# Patient Record
Sex: Female | Born: 1960 | Race: White | Hispanic: No | Smoking: Former smoker
Health system: Southern US, Community
[De-identification: ages and names within clinical notes are randomized; demographics above are authoritative.]

## PROBLEM LIST (undated history)

## (undated) DIAGNOSIS — I10 Essential (primary) hypertension: Secondary | ICD-10-CM

## (undated) DIAGNOSIS — E119 Type 2 diabetes mellitus without complications: Secondary | ICD-10-CM

## (undated) HISTORY — PX: HERNIA REPAIR: SHX51

---

## 2018-12-14 ENCOUNTER — Other Ambulatory Visit (HOSPITAL_BASED_OUTPATIENT_CLINIC_OR_DEPARTMENT_OTHER): Payer: Self-pay | Admitting: Physician Assistant

## 2018-12-14 ENCOUNTER — Other Ambulatory Visit (HOSPITAL_BASED_OUTPATIENT_CLINIC_OR_DEPARTMENT_OTHER): Payer: Self-pay

## 2018-12-14 DIAGNOSIS — R1900 Intra-abdominal and pelvic swelling, mass and lump, unspecified site: Secondary | ICD-10-CM

## 2018-12-18 ENCOUNTER — Other Ambulatory Visit: Payer: Self-pay

## 2018-12-18 ENCOUNTER — Ambulatory Visit (HOSPITAL_BASED_OUTPATIENT_CLINIC_OR_DEPARTMENT_OTHER)
Admission: RE | Admit: 2018-12-18 | Discharge: 2018-12-18 | Disposition: A | Discharge status: Home / Self Care | Payer: BC Managed Care – PPO | Source: Ambulatory Visit | Attending: Physician Assistant | Admitting: Physician Assistant

## 2018-12-18 DIAGNOSIS — R1900 Intra-abdominal and pelvic swelling, mass and lump, unspecified site: Secondary | ICD-10-CM | POA: Insufficient documentation

## 2019-01-05 ENCOUNTER — Other Ambulatory Visit (HOSPITAL_BASED_OUTPATIENT_CLINIC_OR_DEPARTMENT_OTHER): Payer: Self-pay | Admitting: Emergency Medicine

## 2019-01-05 DIAGNOSIS — R1032 Left lower quadrant pain: Secondary | ICD-10-CM

## 2019-01-12 ENCOUNTER — Ambulatory Visit (HOSPITAL_BASED_OUTPATIENT_CLINIC_OR_DEPARTMENT_OTHER)
Admission: RE | Admit: 2019-01-12 | Discharge: 2019-01-12 | Disposition: A | Discharge status: Home / Self Care | Payer: BC Managed Care – PPO | Source: Ambulatory Visit | Attending: Emergency Medicine | Admitting: Emergency Medicine

## 2019-01-12 ENCOUNTER — Encounter (HOSPITAL_BASED_OUTPATIENT_CLINIC_OR_DEPARTMENT_OTHER): Payer: Self-pay

## 2019-01-12 ENCOUNTER — Other Ambulatory Visit: Payer: Self-pay

## 2019-01-12 DIAGNOSIS — R1032 Left lower quadrant pain: Secondary | ICD-10-CM | POA: Insufficient documentation

## 2019-01-12 MED ORDER — IOHEXOL 300 MG/ML  SOLN
100.0000 mL | Freq: Once | INTRAMUSCULAR | Status: AC | PRN
  Administered 2019-01-12: 100 mL via INTRAVENOUS

## 2020-03-03 ENCOUNTER — Emergency Department (INDEPENDENT_AMBULATORY_CARE_PROVIDER_SITE_OTHER)
Admission: EM | Admit: 2020-03-03 | Discharge: 2020-03-03 | Disposition: A | Discharge status: Home / Self Care | Payer: BC Managed Care – PPO | Source: Home / Self Care

## 2020-03-03 ENCOUNTER — Encounter: Payer: Self-pay | Admitting: Emergency Medicine

## 2020-03-03 ENCOUNTER — Other Ambulatory Visit: Payer: Self-pay

## 2020-03-03 DIAGNOSIS — J22 Unspecified acute lower respiratory infection: Secondary | ICD-10-CM

## 2020-03-03 DIAGNOSIS — R059 Cough, unspecified: Secondary | ICD-10-CM

## 2020-03-03 HISTORY — DX: Essential (primary) hypertension: I10

## 2020-03-03 HISTORY — DX: Type 2 diabetes mellitus without complications: E11.9

## 2020-03-03 MED ORDER — METHYLPREDNISOLONE SODIUM SUCC 125 MG IJ SOLR
80.0000 mg | Freq: Once | INTRAMUSCULAR | Status: AC
  Administered 2020-03-03: 80 mg via INTRAMUSCULAR

## 2020-03-03 MED ORDER — PREDNISONE 20 MG PO TABS: 20.0000 mg | ORAL_TABLET | Freq: Two times a day (BID) | ORAL | 0 refills | Status: AC

## 2020-03-03 MED ORDER — AZITHROMYCIN 250 MG PO TABS: ORAL_TABLET | ORAL | 0 refills | Status: AC

## 2020-03-03 NOTE — ED Triage Notes (Signed)
Sinus Congestion, Rt ear Pain x 3 days Unvaccinated

## 2020-03-03 NOTE — Discharge Instructions (Addendum)
Go home to rest Drink plenty of fluids Take Tylenol for pain or fever You may take over-the-counter cough and cold medicines as needed I have prescribed azithromycin and prednisone for the bronchitis You must quarantine at home until your test result is available You can check for your test result in MyChart

## 2020-03-05 LAB — COVID-19, FLU A+B NAA
Influenza A, NAA: NOT DETECTED
Influenza B, NAA: NOT DETECTED
SARS-CoV-2, NAA: NOT DETECTED

## 2021-01-20 IMAGING — US US ABDOMEN LIMITED
1 series · 13 of 25 positions shown · non-contrast
Comparison: None.

CLINICAL DATA: Soft tissue prominence along the superficial left
lower abdominal wall

EXAM:
ULTRASOUND ABDOMEN LIMITED/LEFT LOWER QUADRANT ABDOMINAL WALL

[Series 1: us abdomen limited · 36 acquisitions, 13 frames shown]
[im 1/36]
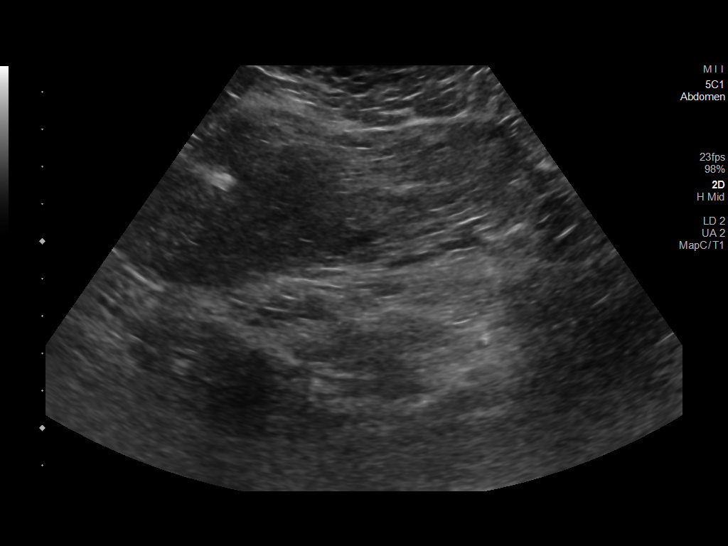
[im 3/36]
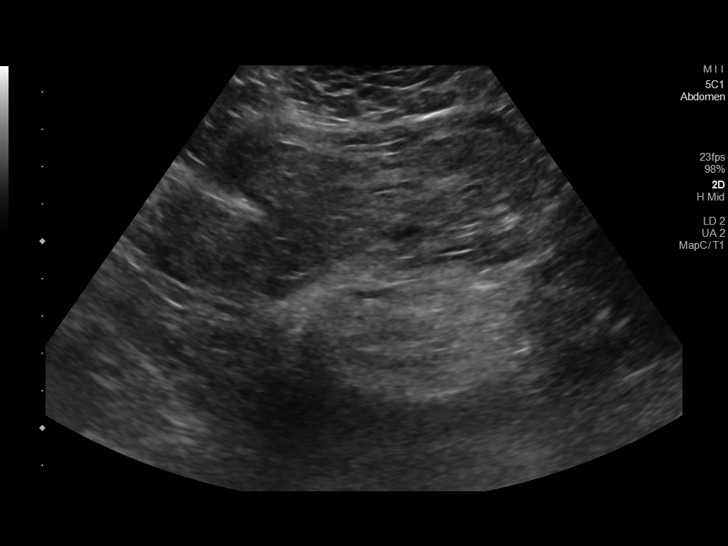
[im 6/36]
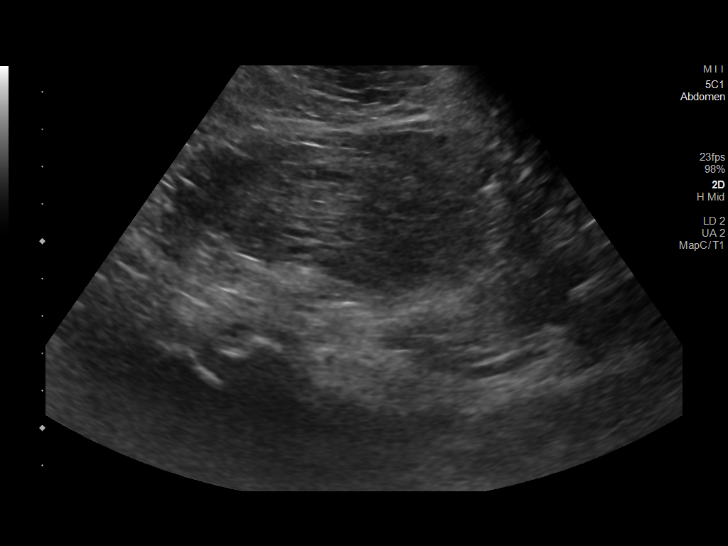
[im 9/36]
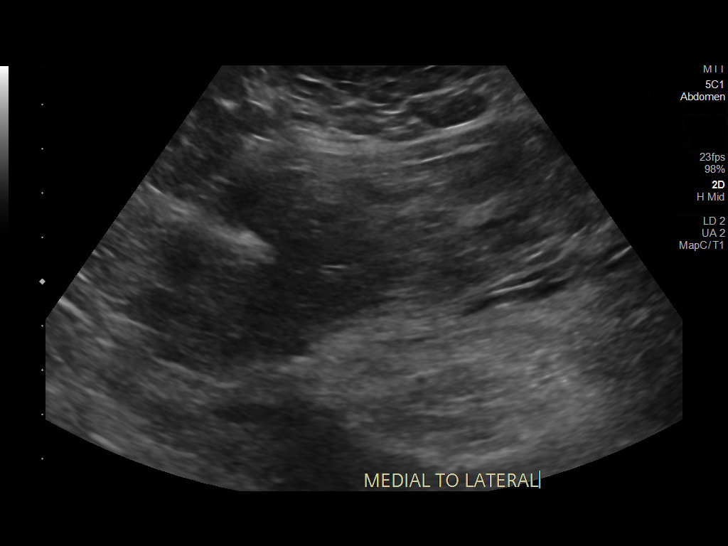
[im 12/36]
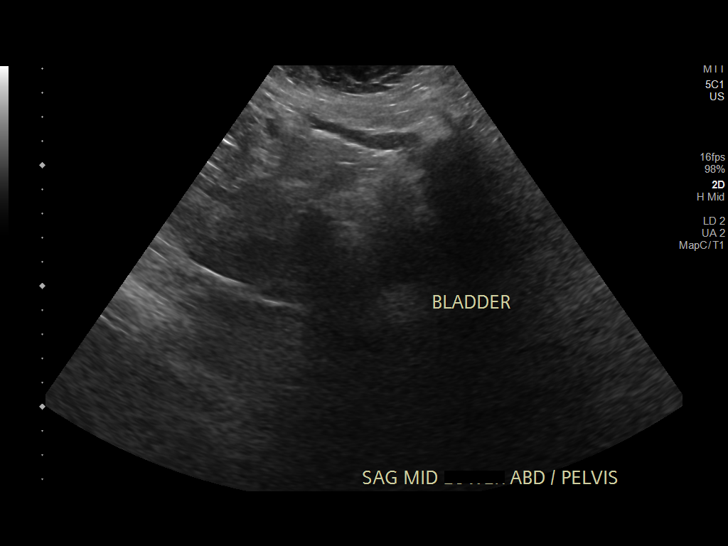
[im 15/36]
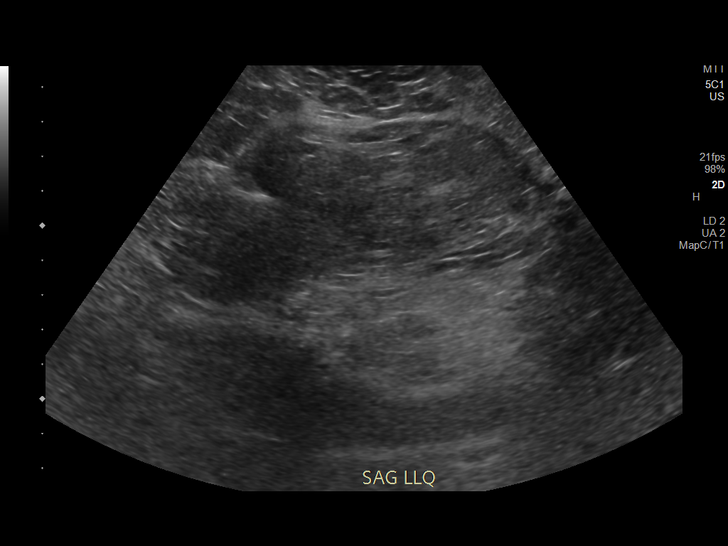
[im 18/36]
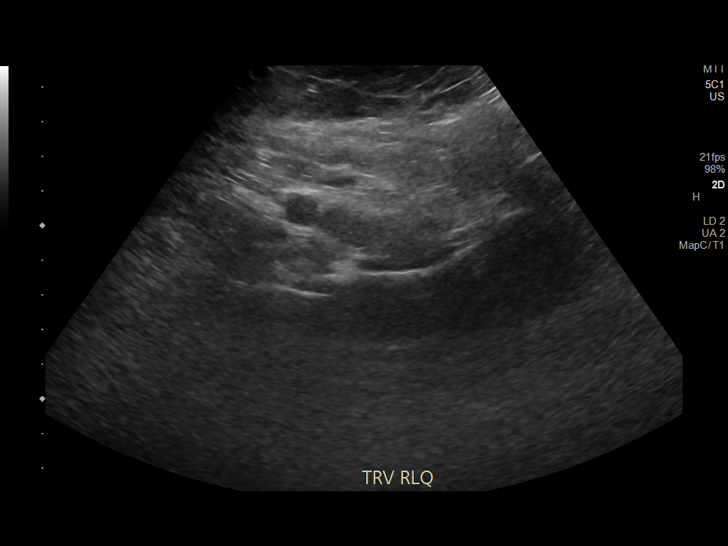
[im 21/36]
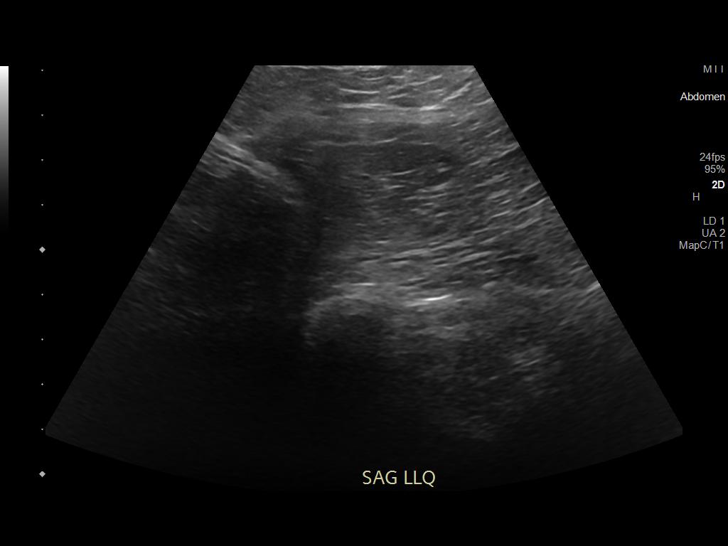
[im 24/36]
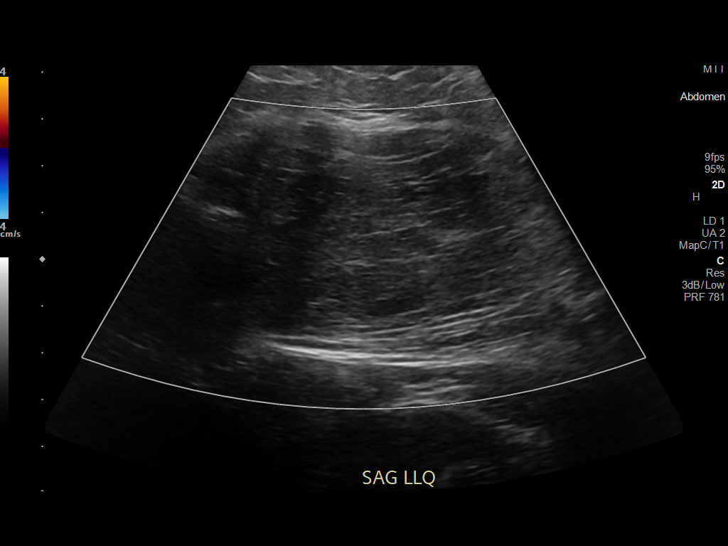
[im 27/36]
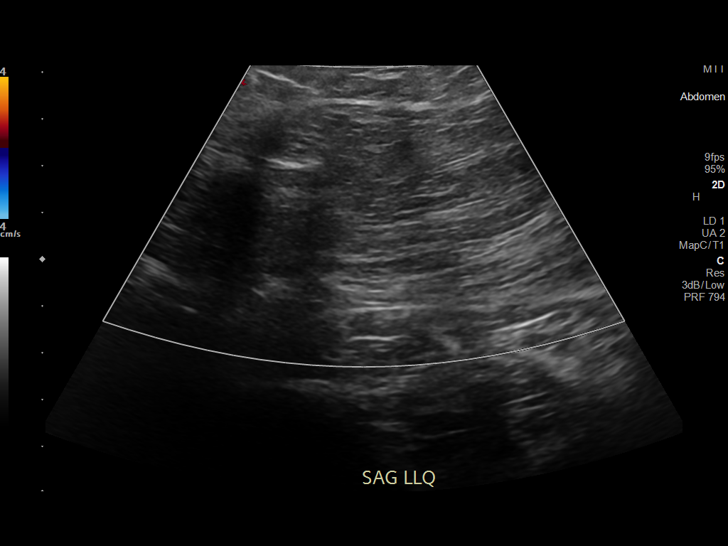
[im 30/36]
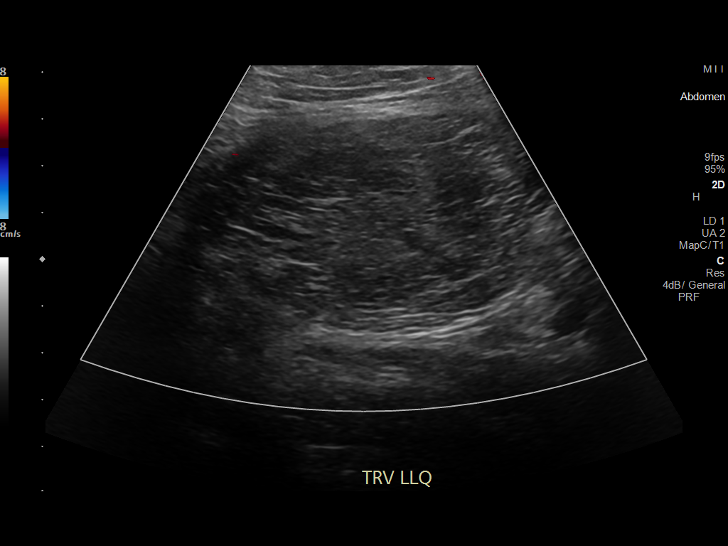
[im 33/36]
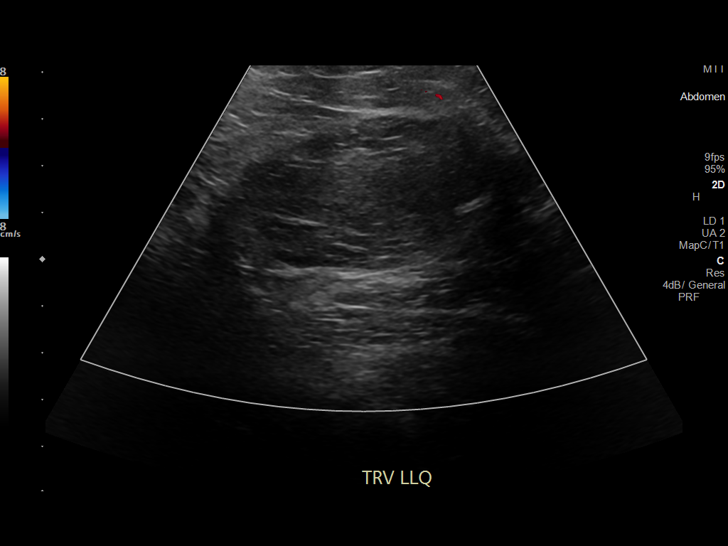
[im 36/36]
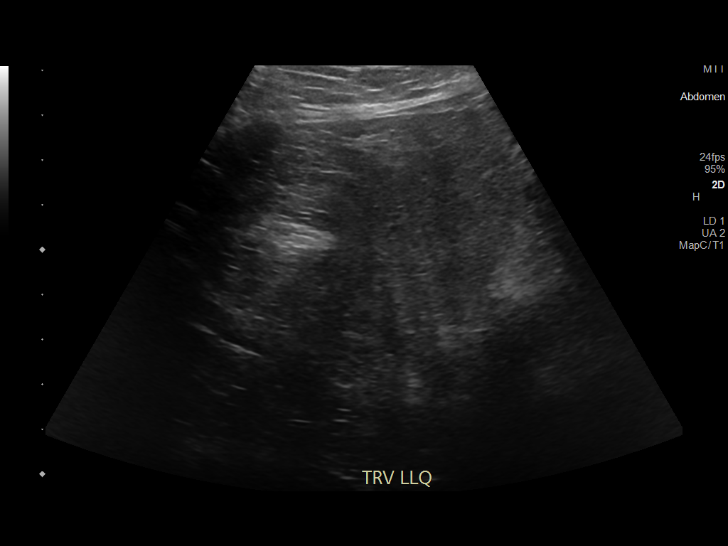

[13 of 25 positions shown; findings below may reference images not displayed]

FINDINGS: Longitudinal and transverse images were obtained along the left
lower quadrant abdominal wall. There is a fairly well-circumscribed
slightly inhomogeneous area of predominantly increased echogenicity
measuring 10.4 x 5.0 x 8.9 cm in the subcutaneous left lower
quadrant abdominal wall region. There is no surrounding fluid. No
nearby adenopathy. No hernia seen in this area.
IMPRESSION: 1. 10.4 x 5.0 x 8.9 cm in the subcutaneous left lower quadrant
abdominal wall at the site of palpable fullness. No associated
hernia. This lesion appears to contain fat but cannot be delineated
as a benign lipoma on this study given some inhomogeneity in this
lesion. Possibility of fat containing neoplasm within this area
cannot be excluded by ultrasound. Given this circumstance, would
advise CT or MR of this area, ideally with intravenous contrast, to
further assess.

2.  No other lesion evident in this area.

These results will be called to the ordering clinician or
representative by the Radiologist Assistant, and communication
documented in the PACS or [employer] Dashboard.

## 2021-02-14 IMAGING — CT CT ABD-PELV W/ CM
2 of 5 series · 16 of 46 positions shown, 18 images · IV contrast (Omnipaque)
Comparison: None.

CLINICAL DATA: Left lower quadrant mass

EXAM:
CT ABDOMEN AND PELVIS WITH CONTRAST
TECHNIQUE: Multidetector CT imaging of the abdomen and pelvis was performed
using the standard protocol following bolus administration of
intravenous contrast.
CONTRAST:  100mL OMNIPAQUE IOHEXOL 300 MG/ML SOLN, additional oral
enteric contrast

[Series 2: axial st · axial · 0.98mm/px · z∈[-579,-109]mm · 13 of 106 slices shown, 15 images]
[im 6/106  soft-tissue]
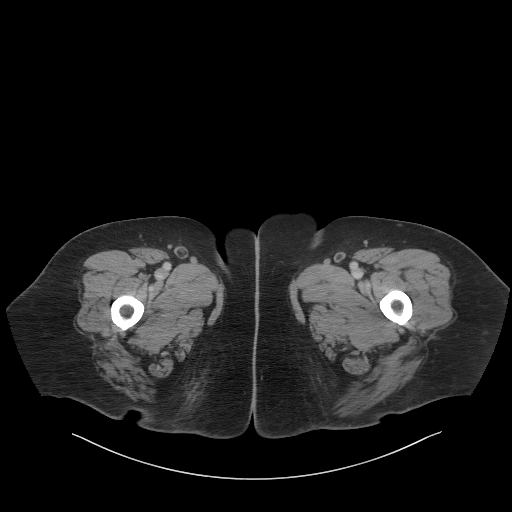
[im 6/106  bone]
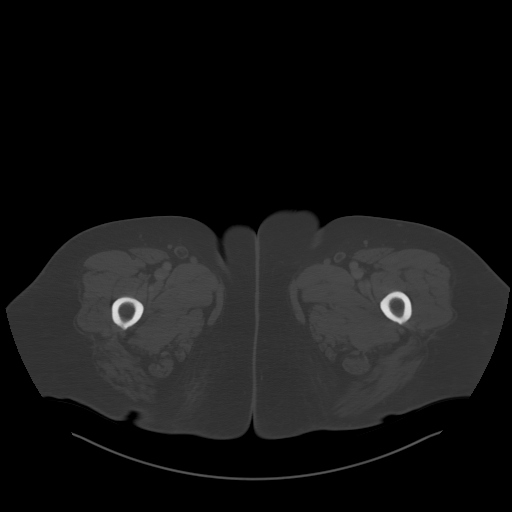
[im 17/106  soft-tissue]
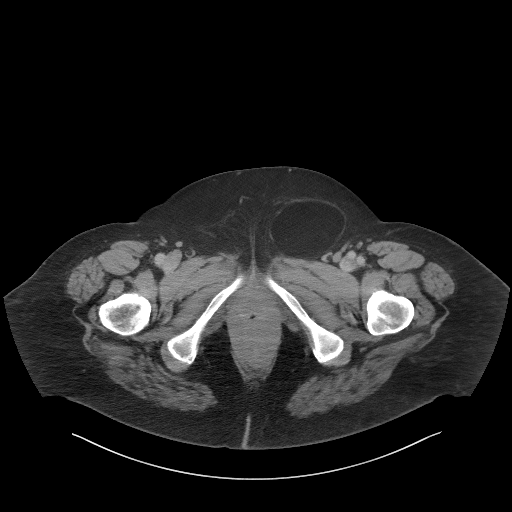
[im 23/106  soft-tissue]
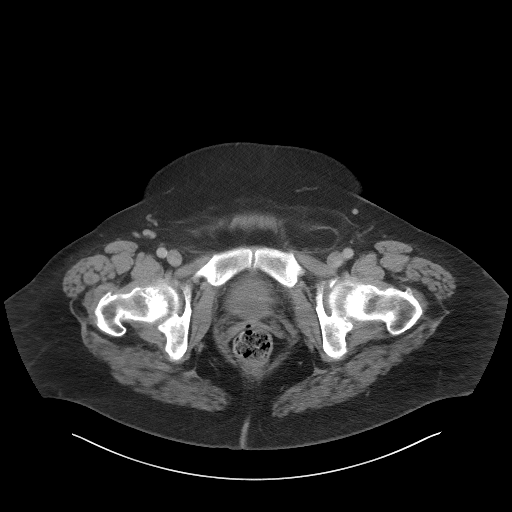
[im 28/106  soft-tissue]
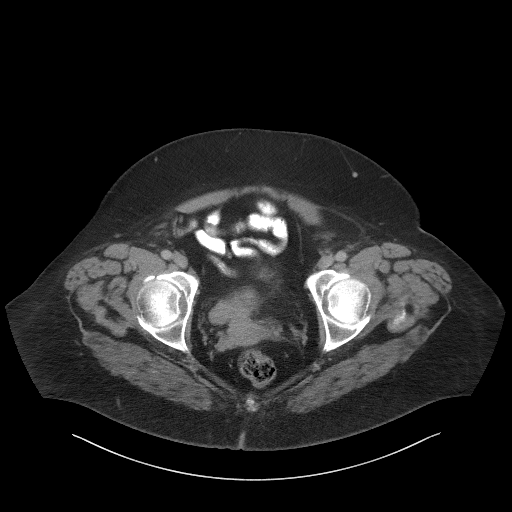
[im 39/106  soft-tissue]
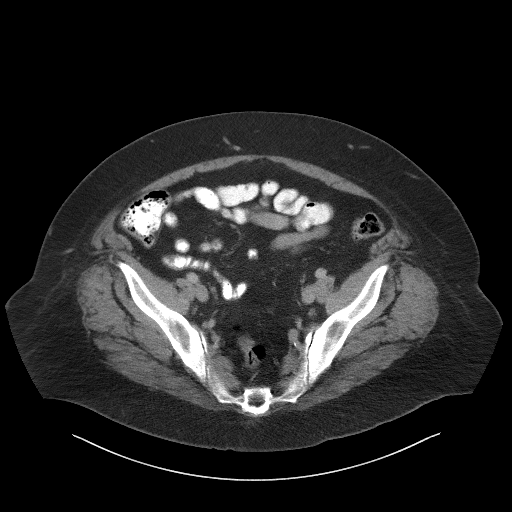
[im 45/106  soft-tissue]
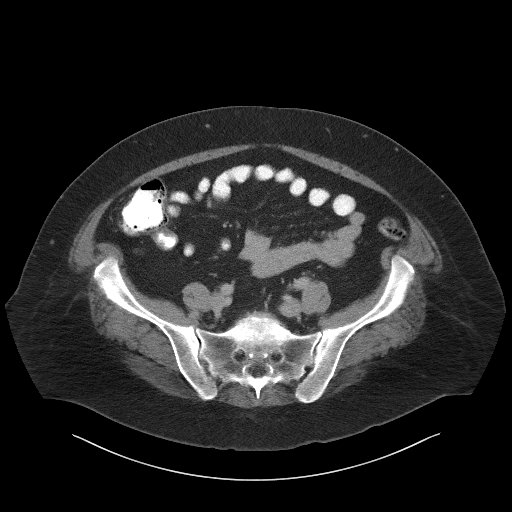
[im 56/106  soft-tissue]
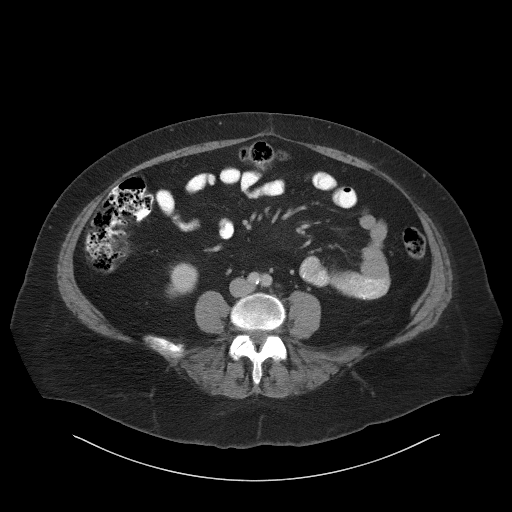
[im 61/106  soft-tissue]
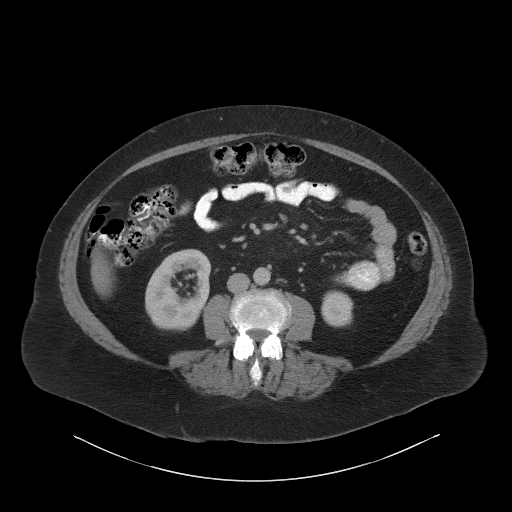
[im 67/106  soft-tissue]
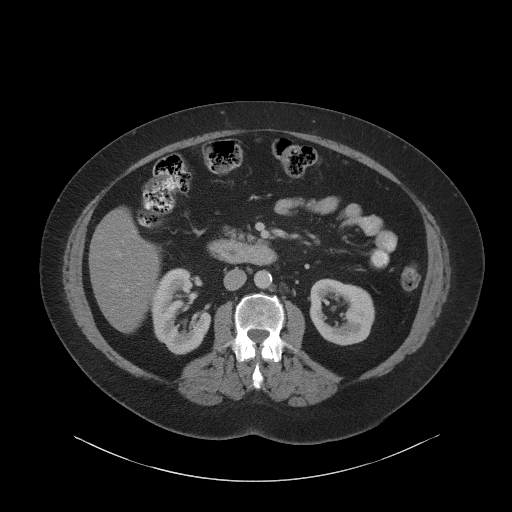
[im 67/106  bone]
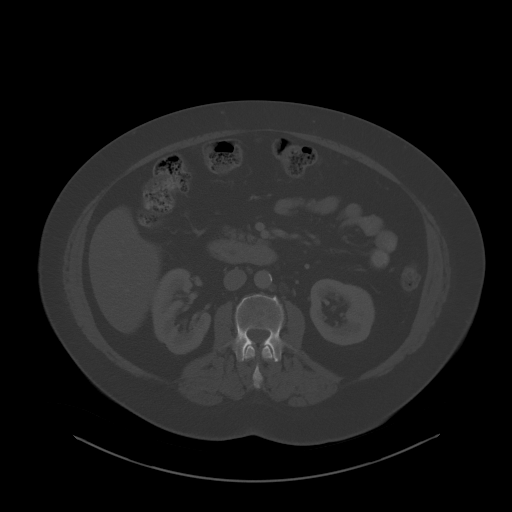
[im 78/106  soft-tissue]
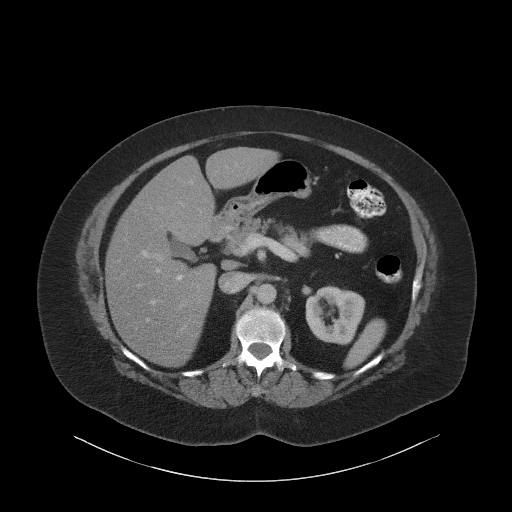
[im 83/106  soft-tissue]
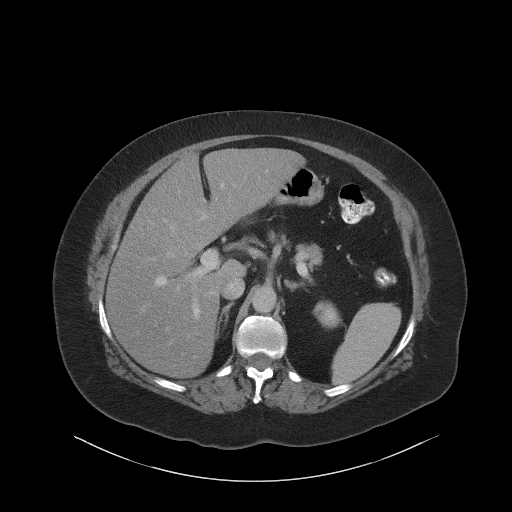
[im 89/106  soft-tissue]
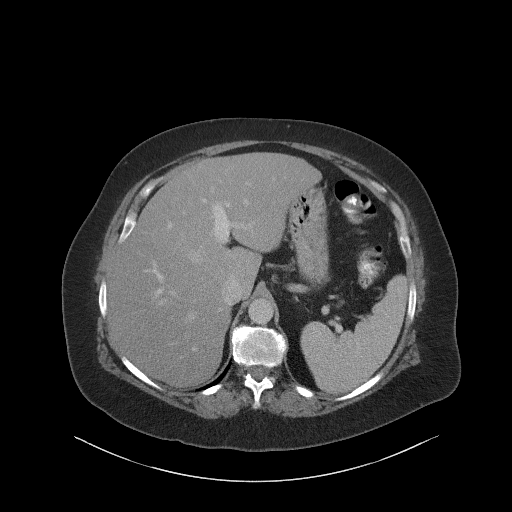
[im 100/106  soft-tissue]
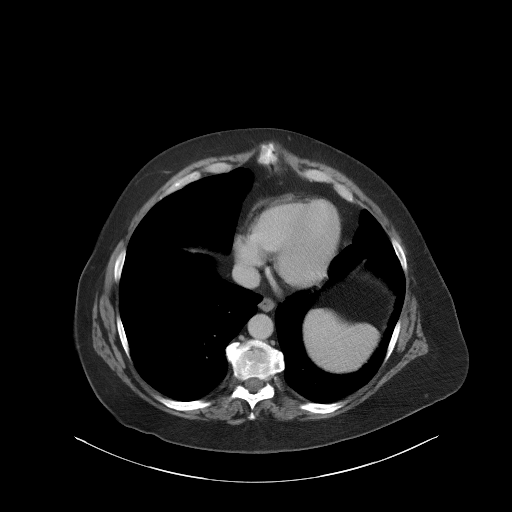

[Series 5: coronal st · coronal · 0.93mm/px · 3 of 117 slices shown]
[im 39/117  soft-tissue]
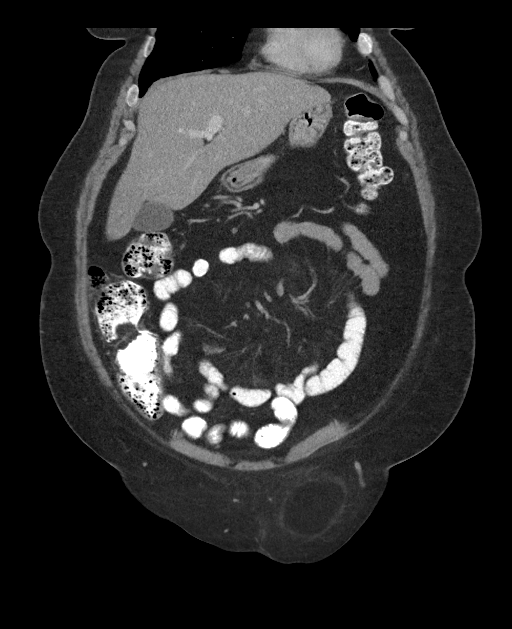
[im 52/117  soft-tissue]
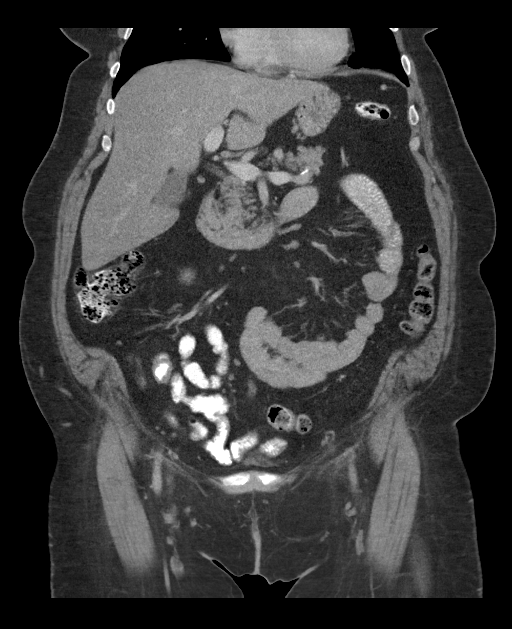
[im 65/117  soft-tissue]
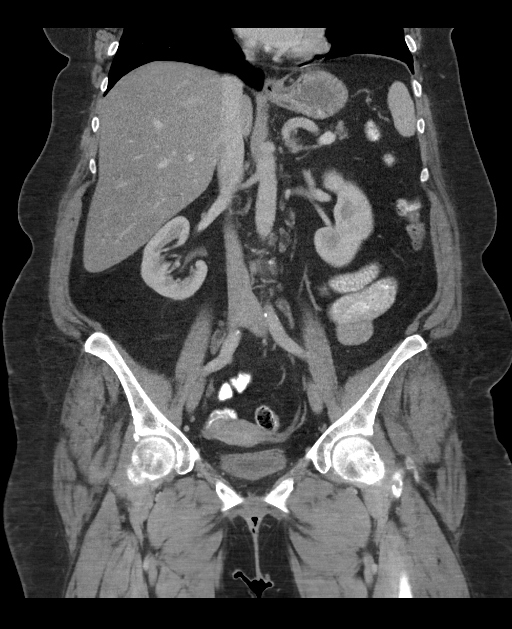

[16 of 46 positions shown; findings below may reference images not displayed]

FINDINGS: Lower chest: No acute abnormality.

Hepatobiliary: No solid liver abnormality is seen. Hepatic
steatosis. No gallstones, gallbladder wall thickening, or biliary
dilatation.

Pancreas: Unremarkable. No pancreatic ductal dilatation or
surrounding inflammatory changes.

Spleen: Normal in size without significant abnormality.

Adrenals/Urinary Tract: Adrenal glands are unremarkable. Kidneys are
normal, without renal calculi, solid lesion, or hydronephrosis.
Bladder is unremarkable.

Stomach/Bowel: Stomach is within normal limits. Small incidental
diverticulum of the descending duodenum. Appendix appears normal. No
evidence of bowel wall thickening, distention, or inflammatory
changes.

Vascular/Lymphatic: Aortic atherosclerosis. No enlarged abdominal or
pelvic lymph nodes.

Reproductive: No mass or other significant abnormality.

Other: Large, fat containing left inguinal hernia measuring 7.5 x
5.8 x 6.5 cm, which appears to be indirect (series 2, image 91,
series 6, image 93). No abdominopelvic ascites.

Musculoskeletal: No acute or significant osseous findings.
IMPRESSION: 1. Large, fat containing left inguinal hernia measuring 7.5 x 5.8 x
6.5 cm, which appears to be indirect (series 2, image 91, series 6,
image 93).

2.  Hepatic steatosis.

3.  Aortic Atherosclerosis ([id number]).
